# Patient Record
Sex: Female | Born: 2000 | Race: White | Hispanic: No | Marital: Single | Smoking: Never smoker
Health system: Southern US, Community
[De-identification: ages and names within clinical notes are randomized; demographics above are authoritative.]

## PROBLEM LIST (undated history)

## (undated) DIAGNOSIS — J45909 Unspecified asthma, uncomplicated: Secondary | ICD-10-CM

## (undated) DIAGNOSIS — Z62820 Parent-biological child conflict: Secondary | ICD-10-CM

## (undated) DIAGNOSIS — F913 Oppositional defiant disorder: Secondary | ICD-10-CM

## (undated) DIAGNOSIS — F419 Anxiety disorder, unspecified: Secondary | ICD-10-CM

---
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
## 2023-09-29 DIAGNOSIS — R300 Dysuria: Secondary | ICD-10-CM | POA: Diagnosis not present

## 2023-09-29 DIAGNOSIS — N760 Acute vaginitis: Secondary | ICD-10-CM | POA: Diagnosis not present

## 2024-01-06 DIAGNOSIS — Z13220 Encounter for screening for lipoid disorders: Secondary | ICD-10-CM | POA: Diagnosis not present

## 2024-01-06 DIAGNOSIS — Z1331 Encounter for screening for depression: Secondary | ICD-10-CM | POA: Diagnosis not present

## 2024-01-06 DIAGNOSIS — Z01411 Encounter for gynecological examination (general) (routine) with abnormal findings: Secondary | ICD-10-CM | POA: Diagnosis not present

## 2024-01-06 DIAGNOSIS — Z1321 Encounter for screening for nutritional disorder: Secondary | ICD-10-CM | POA: Diagnosis not present

## 2024-01-06 DIAGNOSIS — Z113 Encounter for screening for infections with a predominantly sexual mode of transmission: Secondary | ICD-10-CM | POA: Diagnosis not present

## 2024-01-06 DIAGNOSIS — Z131 Encounter for screening for diabetes mellitus: Secondary | ICD-10-CM | POA: Diagnosis not present

## 2024-01-06 DIAGNOSIS — F330 Major depressive disorder, recurrent, mild: Secondary | ICD-10-CM | POA: Diagnosis not present

## 2024-01-06 DIAGNOSIS — Z124 Encounter for screening for malignant neoplasm of cervix: Secondary | ICD-10-CM | POA: Diagnosis not present

## 2024-02-02 ENCOUNTER — Ambulatory Visit
Admission: EM | Admit: 2024-02-02 | Discharge: 2024-02-02 | Disposition: A | Discharge status: Home / Self Care | Source: Ambulatory Visit | Attending: Emergency Medicine | Admitting: Emergency Medicine

## 2024-02-02 DIAGNOSIS — J029 Acute pharyngitis, unspecified: Secondary | ICD-10-CM

## 2024-02-02 DIAGNOSIS — J039 Acute tonsillitis, unspecified: Secondary | ICD-10-CM | POA: Insufficient documentation

## 2024-02-02 LAB — POCT RAPID STREP A (OFFICE): Rapid Strep A Screen: NEGATIVE

## 2024-02-02 MED ORDER — PREDNISONE 10 MG (21) PO TBPK
ORAL_TABLET | Freq: Every day | ORAL | 0 refills | Status: AC
Start: 2024-02-02 — End: ?

## 2024-02-02 NOTE — ED Triage Notes (Signed)
Sore throat, body aches x 1 day

## 2024-02-02 NOTE — ED Provider Notes (Signed)
 Hannah Oliver    CSN: [id number] Arrival date & time: 02/02/24  9057      History   Chief Complaint Chief Complaint  Patient presents with   Sore Throat    HPI Hannah Oliver is a 23 y.o. female.  Patient presents with sore throat and bodyaches since yesterday.  She states her left tonsil feels swollen.  No fever, rash, cough, shortness of breath.  No OTC medications taken today.  The history is provided by the patient and medical records.    Past Medical History:  Diagnosis Date   Anxiety    Asthma    Pt's grandmother states pt outgrew   ODD (oppositional defiant disorder) 04/26/2015   Parent-child relational problem 04/26/2015    Patient Active Problem List   Diagnosis Date Noted   Oppositional defiant disorder 04/26/2015   Parent-child relational problem 04/26/2015   Mood disorder (HCC) 04/25/2015   MDD (major depressive disorder), single episode, moderate (HCC) 07/11/2012   GAD (generalized anxiety disorder) 07/11/2012    History reviewed. No pertinent surgical history.  OB History   No obstetric history on file.      Home Medications    Prior to Admission medications   Medication Sig Start Date End Date Taking? Authorizing Provider  escitalopram (LEXAPRO) 10 MG tablet Take 10 mg by mouth daily. 01/06/24  Yes [provider]  predniSONE (STERAPRED UNI-PAK 21 TAB) 10 MG (21) TBPK tablet Take by mouth daily. As directed 02/02/24  Yes Corlis Burnard DEL, NP  ibuprofen  (ADVIL ,MOTRIN ) 200 MG tablet Take 400 mg by mouth every 6 (six) hours as needed for headache.    [provider]    Family History History reviewed. No pertinent family history.  Social History Social History   Tobacco Use   Smoking status: Never  Substance Use Topics   Alcohol use: No   Drug use: No     Allergies   Patient has no known allergies.   Review of Systems Review of Systems  Constitutional:  Negative for chills and fever.  HENT:  Positive for sore  throat. Negative for ear pain.   Respiratory:  Negative for cough and shortness of breath.      Physical Exam Triage Vital Signs ED Triage Vitals  Encounter Vitals Group     BP 02/02/24 0951 118/76     Girls Systolic BP Percentile --      Girls Diastolic BP Percentile --      Boys Systolic BP Percentile --      Boys Diastolic BP Percentile --      Pulse Rate 02/02/24 0951 80     Resp 02/02/24 0951 18     Temp 02/02/24 0951 98 F (36.7 C)     Temp src --      SpO2 02/02/24 0951 99 %     Weight --      Height --      Head Circumference --      Peak Flow --      Pain Score 02/02/24 0957 6     Pain Loc --      Pain Education --      Exclude from Growth Chart --    No data found.  Updated Vital Signs BP 118/76   Pulse 80   Temp 98 F (36.7 C)   Resp 18   LMP 01/24/2024 (Approximate)   SpO2 99%   Visual Acuity Right Eye Distance:   Left Eye Distance:   Bilateral  Distance:    Right Eye Near:   Left Eye Near:    Bilateral Near:     Physical Exam Constitutional:      General: She is not in acute distress. HENT:     Right Ear: Tympanic membrane normal.     Left Ear: Tympanic membrane normal.     Nose: Nose normal.     Mouth/Throat:     Mouth: Mucous membranes are moist.     Pharynx: Oropharyngeal exudate and posterior oropharyngeal erythema present.     Tonsils: 1+ on the right. 2+ on the left.  Cardiovascular:     Rate and Rhythm: Normal rate and regular rhythm.     Heart sounds: Normal heart sounds.  Pulmonary:     Effort: Pulmonary effort is normal. No respiratory distress.     Breath sounds: Normal breath sounds.  Neurological:     Mental Status: She is alert.      UC Treatments / Results  Labs (all labs ordered are listed, but only abnormal results are displayed) Labs Reviewed  CULTURE, GROUP A STREP [facility])  POCT RAPID STREP A (OFFICE)    EKG   Radiology No results found.  Procedures Procedures (including critical care  time)  Medications Ordered in UC Medications - No data to display  Initial Impression / Assessment and Plan / UC Course  I have reviewed the triage vital signs and the nursing notes.  Pertinent labs & imaging results that were available during my care of the patient were reviewed by me and considered in my medical decision making (see chart for details).    Acute tonsillitis and pharyngitis.  Afebrile and vital signs are stable.  Rapid strep negative; culture pending.  Treating today with prednisone taper.  Instructed patient to follow-up with her PCP.  Strict ED precautions given.  Education provided on tonsillitis and pharyngitis.  Patient agrees to plan of care.  Final Clinical Impressions(s) / UC Diagnoses   Final diagnoses:  Acute tonsillitis, unspecified etiology  Acute pharyngitis, unspecified etiology     Discharge Instructions      The strep test is negative.  A throat culture is pending.    Take the prednisone as directed.    Follow up with your primary care provider.  Go to the emergency department if you have worsening symptoms.        ED Prescriptions     Medication Sig Dispense Auth. Provider   predniSONE (STERAPRED UNI-PAK 21 TAB) 10 MG (21) TBPK tablet Take by mouth daily. As directed 21 tablet Corlis Burnard DEL, NP      PDMP not reviewed this encounter.   Corlis Burnard DEL, NP 02/02/24 1023

## 2024-02-02 NOTE — Discharge Instructions (Addendum)
 The strep test is negative.  A throat culture is pending.    Take the prednisone as directed.    Follow up with your primary care provider.  Go to the emergency department if you have worsening symptoms.

## 2024-02-05 LAB — CULTURE, GROUP A STREP (THRC)

## 2024-02-07 ENCOUNTER — Ambulatory Visit (HOSPITAL_COMMUNITY): Payer: Self-pay

## 2024-02-23 DIAGNOSIS — B9689 Other specified bacterial agents as the cause of diseases classified elsewhere: Secondary | ICD-10-CM | POA: Diagnosis not present

## 2024-02-23 DIAGNOSIS — N760 Acute vaginitis: Secondary | ICD-10-CM | POA: Diagnosis not present

## 2024-02-23 DIAGNOSIS — F330 Major depressive disorder, recurrent, mild: Secondary | ICD-10-CM | POA: Diagnosis not present

## 2024-02-23 DIAGNOSIS — Z113 Encounter for screening for infections with a predominantly sexual mode of transmission: Secondary | ICD-10-CM | POA: Diagnosis not present

## 2024-02-23 DIAGNOSIS — Z1331 Encounter for screening for depression: Secondary | ICD-10-CM | POA: Diagnosis not present

## 2024-04-12 ENCOUNTER — Ambulatory Visit
Admission: EM | Admit: 2024-04-12 | Discharge: 2024-04-12 | Disposition: A | Discharge status: Home / Self Care | Attending: Emergency Medicine | Admitting: Emergency Medicine

## 2024-04-12 DIAGNOSIS — T148XXA Other injury of unspecified body region, initial encounter: Secondary | ICD-10-CM | POA: Diagnosis not present

## 2024-04-12 DIAGNOSIS — L089 Local infection of the skin and subcutaneous tissue, unspecified: Secondary | ICD-10-CM | POA: Diagnosis not present

## 2024-04-12 DIAGNOSIS — J069 Acute upper respiratory infection, unspecified: Secondary | ICD-10-CM | POA: Diagnosis not present

## 2024-04-12 LAB — POC SOFIA SARS ANTIGEN FIA: SARS Coronavirus 2 Ag: NEGATIVE

## 2024-04-12 MED ORDER — MUPIROCIN 2 % EX OINT: 1.0000 | TOPICAL_OINTMENT | Freq: Two times a day (BID) | CUTANEOUS | 0 refills | Status: AC

## 2024-04-12 NOTE — ED Triage Notes (Addendum)
 Patient to Urgent Care with complaints of nasal congestion/ body aches. Denies any known fevers.   Symptoms started yesterday. No otc meds.   Also presents w/ concerns about an infected tattoo. Received tattoo on her left upper arm approx 1 week ago. Has one area w/ purulent drainage (symptoms x2 days).

## 2024-04-12 NOTE — Discharge Instructions (Addendum)
 Use the mupirocin  ointment as directed.    Your COVID test is negative.  Take Tylenol  or ibuprofen  as needed for fever or discomfort.    Follow up with your primary care provider tomorrow.  Go to the emergency department if you have worsening symptoms.

## 2024-04-12 NOTE — ED Provider Notes (Signed)
 [name]    CSN: [id number] Arrival date & time: 04/12/24  0820      History   Chief Complaint Chief Complaint  Patient presents with   Nasal Congestion   Generalized Body Aches    HPI Hannah Oliver is a 23 y.o. female.  Patient presents with 1 day history of nasal congestion, runny nose, body aches.  No OTC medications taken.  No fever, chills, cough, shortness of breath, vomiting, diarrhea.  Patient also presents with a scabbed wound at the site of a tattoo on left upper arm that she received 1 week ago.  She reports purulent drainage from the site with redness.  No treatments at home.    The history is provided by the patient and medical records.    Past Medical History:  Diagnosis Date   Anxiety    Asthma    Pt's grandmother states pt outgrew   ODD (oppositional defiant disorder) 04/26/2015   Parent-child relational problem 04/26/2015    Patient Active Problem List   Diagnosis Date Noted   Oppositional defiant disorder 04/26/2015   Parent-child relational problem 04/26/2015   Mood disorder (HCC) 04/25/2015   MDD (major depressive disorder), single episode, moderate (HCC) 07/11/2012   GAD (generalized anxiety disorder) 07/11/2012    History reviewed. No pertinent surgical history.  OB History   No obstetric history on file.      Home Medications    Prior to Admission medications   Medication Sig Start Date End Date Taking? Authorizing Provider  mupirocin  ointment (BACTROBAN ) 2 % Apply 1 Application topically 2 (two) times daily. 04/12/24  Yes Corlis Burnard DEL, NP  escitalopram (LEXAPRO) 10 MG tablet Take 10 mg by mouth daily. 01/06/24   [provider]  ibuprofen  (ADVIL ,MOTRIN ) 200 MG tablet Take 400 mg by mouth every 6 (six) hours as needed for headache.    [provider]  predniSONE  (STERAPRED UNI-PAK 21 TAB) 10 MG (21) TBPK tablet Take by mouth daily. As directed Patient not taking: Reported on 04/12/2024 02/02/24   Corlis Burnard DEL, NP     Family History History reviewed. No pertinent family history.  Social History Social History   Tobacco Use   Smoking status: Never  Substance Use Topics   Alcohol use: No   Drug use: No     Allergies   Patient has no known allergies.   Review of Systems Review of Systems  Constitutional:  Negative for chills and fever.  HENT:  Positive for congestion and rhinorrhea. Negative for ear pain and sore throat.   Respiratory:  Negative for cough and shortness of breath.   Cardiovascular:  Negative for chest pain and palpitations.  Gastrointestinal:  Negative for diarrhea and vomiting.  Skin:  Positive for color change and wound.     Physical Exam Triage Vital Signs ED Triage Vitals  Encounter Vitals Group     BP 04/12/24 0848 117/81     Girls Systolic BP Percentile --      Girls Diastolic BP Percentile --      Boys Systolic BP Percentile --      Boys Diastolic BP Percentile --      Pulse Rate 04/12/24 0848 80     Resp 04/12/24 0848 18     Temp 04/12/24 0848 98.1 F (36.7 C)     Temp src --      SpO2 04/12/24 0848 99 %     Weight --      Height --  Head Circumference --      Peak Flow --      Pain Score 04/12/24 0846 1     Pain Loc --      Pain Education --      Exclude from Growth Chart --    No data found.  Updated Vital Signs BP 117/81   Pulse 80   Temp 98.1 F (36.7 C)   Resp 18   SpO2 99%   Visual Acuity Right Eye Distance:   Left Eye Distance:   Bilateral Distance:    Right Eye Near:   Left Eye Near:    Bilateral Near:     Physical Exam Constitutional:      General: She is not in acute distress. HENT:     Right Ear: Tympanic membrane normal.     Left Ear: Tympanic membrane normal.     Nose: Nose normal.     Mouth/Throat:     Mouth: Mucous membranes are moist.     Pharynx: Oropharynx is clear.  Cardiovascular:     Rate and Rhythm: Normal rate and regular rhythm.     Heart sounds: Normal heart sounds.  Pulmonary:     Effort:  Pulmonary effort is normal. No respiratory distress.     Breath sounds: Normal breath sounds.  Musculoskeletal:        General: No swelling or deformity. Normal range of motion.  Skin:    General: Skin is warm and dry.     Capillary Refill: Capillary refill takes less than 2 seconds.     Findings: Erythema and lesion present.     Comments: Left upper arm has small scabbed lesion with slight localized erythema.  See picture.  Neurological:     General: No focal deficit present.     Mental Status: She is alert.     Sensory: No sensory deficit.     Motor: No weakness.      UC Treatments / Results  Labs (all labs ordered are listed, but only abnormal results are displayed) Labs Reviewed  POC SOFIA SARS ANTIGEN FIA - Normal    EKG   Radiology No results found.  Procedures Procedures (including critical care time)  Medications Ordered in UC Medications - No data to display  Initial Impression / Assessment and Plan / UC Course  I have reviewed the triage vital signs and the nursing notes.  Pertinent labs & imaging results that were available during my care of the patient were reviewed by me and considered in my medical decision making (see chart for details).    Wound infection, viral URI.  Afebrile and vital signs are stable.  No medications taken today.  Rapid COVID test is negative.  Discussed symptomatic management including Tylenol  or ibuprofen  as needed.  The scabbed lesion on her left upper arm has some slight localized erythema.  Treating with mupirocin  ointment.  Education provided on wound infection.  Instructed her to follow-up with her PCP.  ED precautions given.  She agrees to plan of care.  Final Clinical Impressions(s) / UC Diagnoses   Final diagnoses:  Wound infection  Viral URI     Discharge Instructions      Use the mupirocin  ointment as directed.    Your COVID test is negative.  Take Tylenol  or ibuprofen  as needed for fever or discomfort.     Follow up with your primary care provider tomorrow.  Go to the emergency department if you have worsening symptoms.  ED Prescriptions     Medication Sig Dispense Auth. Provider   mupirocin  ointment (BACTROBAN ) 2 % Apply 1 Application topically 2 (two) times daily. 22 g Corlis Burnard DEL, NP      PDMP not reviewed this encounter.   Corlis Burnard DEL, NP 04/12/24 [phone]
